# Patient Record
Sex: Female | Born: 1990 | Race: White | Hispanic: No | Marital: Single | Smoking: Never smoker
Health system: Southern US, Community
[De-identification: ages and names within clinical notes are randomized; demographics above are authoritative.]

## PROBLEM LIST (undated history)

## (undated) DIAGNOSIS — F4310 Post-traumatic stress disorder, unspecified: Secondary | ICD-10-CM

## (undated) DIAGNOSIS — K259 Gastric ulcer, unspecified as acute or chronic, without hemorrhage or perforation: Secondary | ICD-10-CM

## (undated) HISTORY — PX: KNEE SURGERY: SHX244

---

## 2011-05-01 ENCOUNTER — Ambulatory Visit (INDEPENDENT_AMBULATORY_CARE_PROVIDER_SITE_OTHER)
Admission: RE | Admit: 2011-05-01 | Discharge: 2011-05-01 | Disposition: A | Discharge status: Home / Self Care | Payer: BC Managed Care – PPO | Source: Ambulatory Visit | Attending: Family Medicine | Admitting: Family Medicine

## 2011-05-01 ENCOUNTER — Other Ambulatory Visit (HOSPITAL_BASED_OUTPATIENT_CLINIC_OR_DEPARTMENT_OTHER): Payer: Self-pay | Admitting: Family Medicine

## 2011-05-01 ENCOUNTER — Ambulatory Visit (HOSPITAL_BASED_OUTPATIENT_CLINIC_OR_DEPARTMENT_OTHER)
Admission: RE | Admit: 2011-05-01 | Discharge: 2011-05-01 | Disposition: A | Discharge status: Home / Self Care | Payer: BC Managed Care – PPO | Source: Ambulatory Visit | Attending: Internal Medicine | Admitting: Internal Medicine

## 2011-05-01 DIAGNOSIS — R109 Unspecified abdominal pain: Secondary | ICD-10-CM

## 2012-01-23 IMAGING — CT CT ABD-PELV W/O CM
2 of 4 series · 16 of 46 positions shown, 18 images · non-contrast
Comparison: None

CLINICAL DATA: 19-year-old female with right flank, abdominal and
pelvic pain.

CT ABDOMEN AND PELVIS WITHOUT CONTRAST
TECHNIQUE: Multidetector CT imaging of the abdomen and pelvis was
performed following the standard protocol without intravenous
contrast.

[Series 2: renal stone < 200 lbs 5.0 b31f · axial · 0.79mm/px · z∈[-436,-46]mm · 13 of 86 slices shown, 15 images]
[im 4/86  soft-tissue]
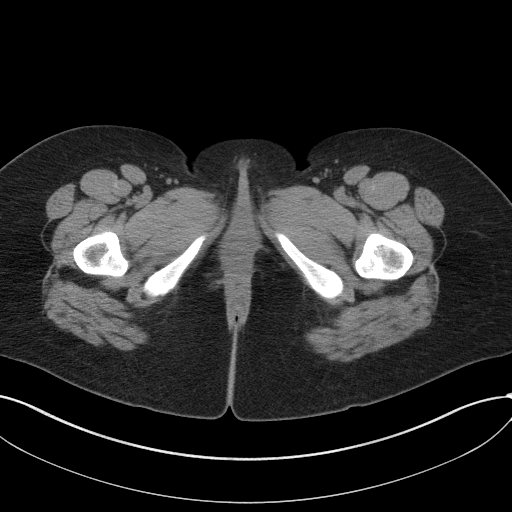
[im 4/86  bone]
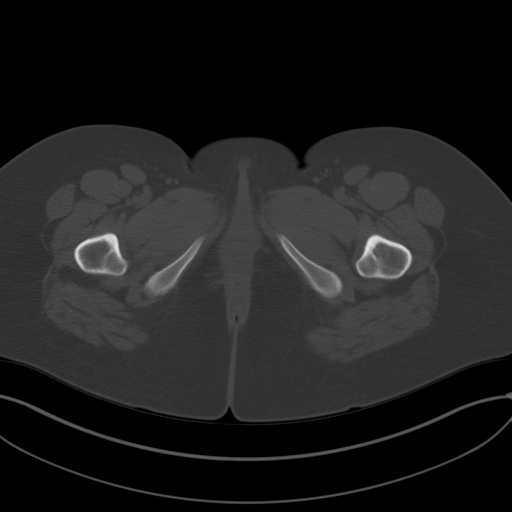
[im 11/86  soft-tissue]
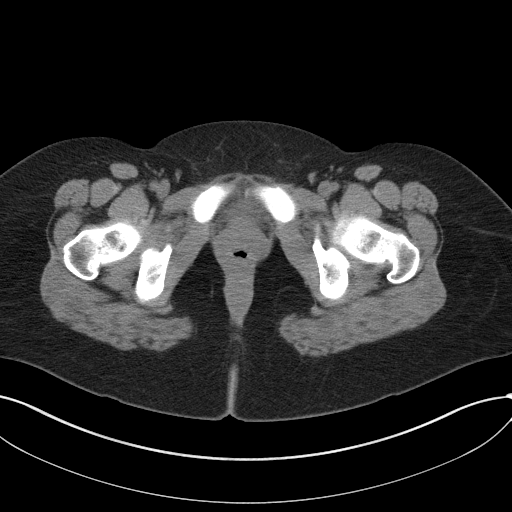
[im 18/86  soft-tissue]
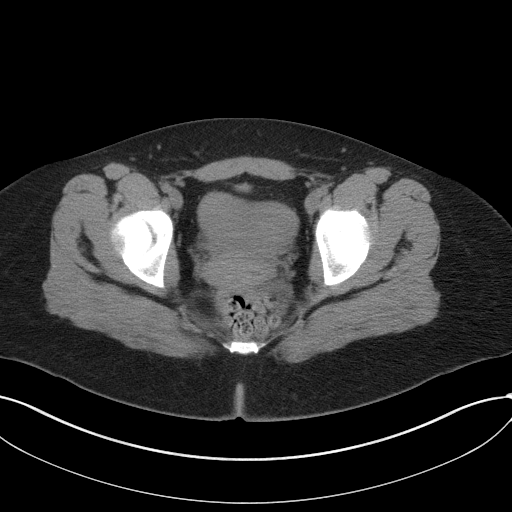
[im 25/86  soft-tissue]
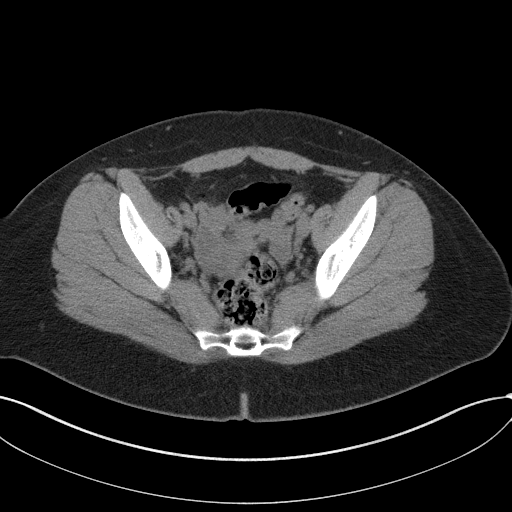
[im 29/86  soft-tissue]
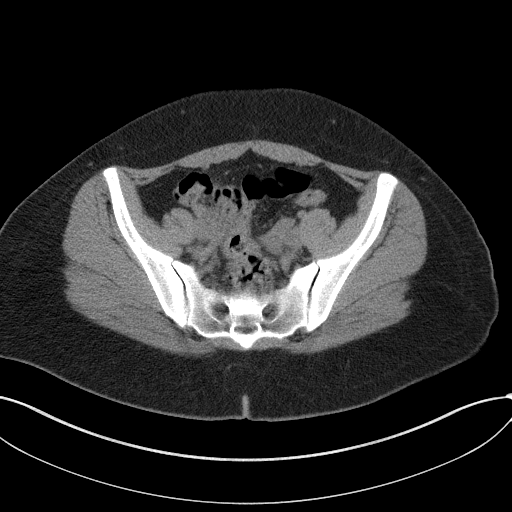
[im 36/86  soft-tissue]
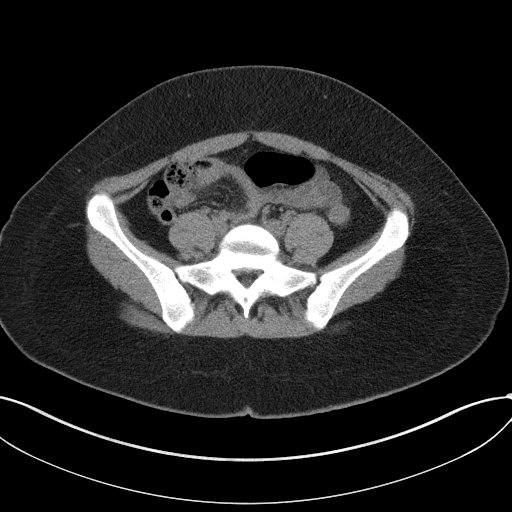
[im 43/86  soft-tissue]
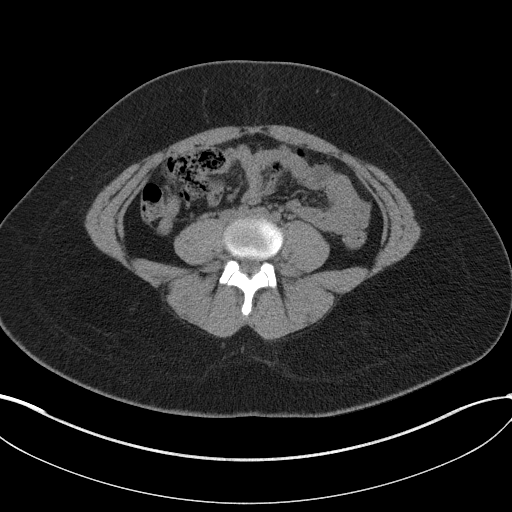
[im 50/86  soft-tissue]
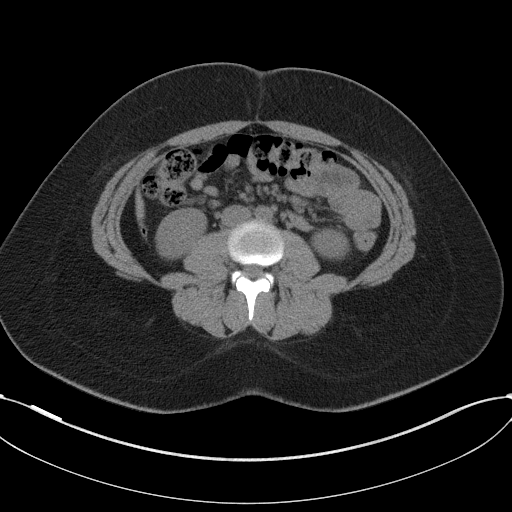
[im 57/86  soft-tissue]
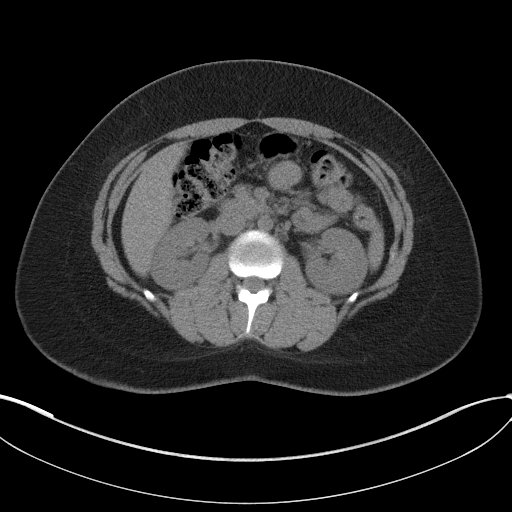
[im 57/86  bone]
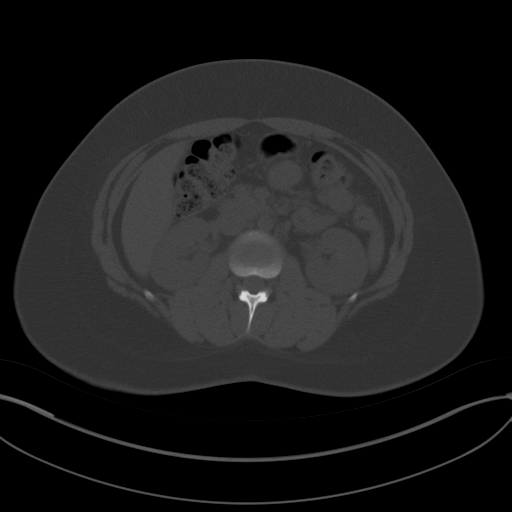
[im 61/86  soft-tissue]
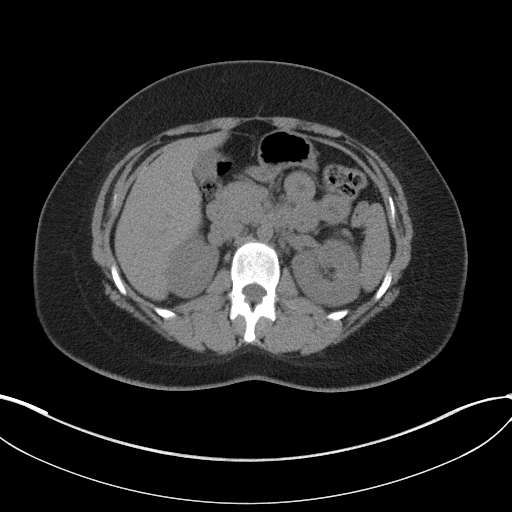
[im 68/86  soft-tissue]
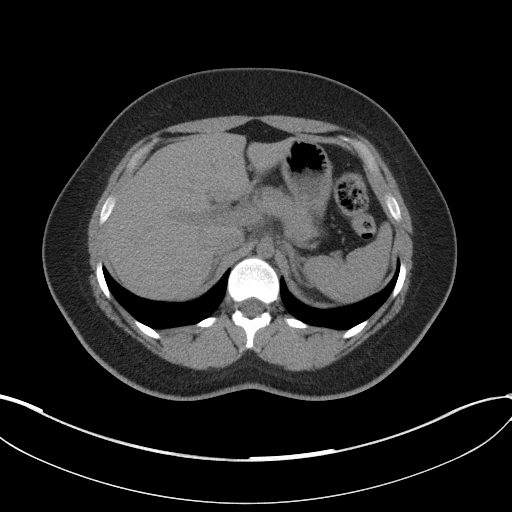
[im 75/86  soft-tissue]
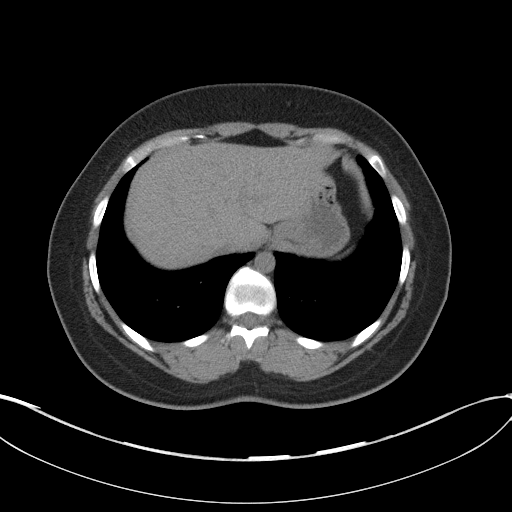
[im 82/86  soft-tissue]
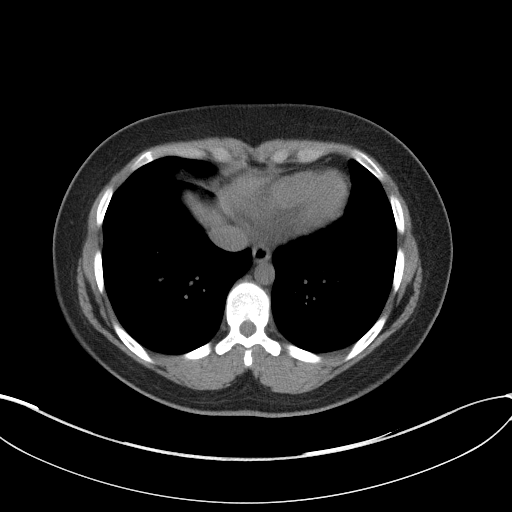

[Series 5: renal stone 3.0 coronal · coronal · 0.84mm/px · 3 of 71 slices shown]
[im 24/71  soft-tissue]
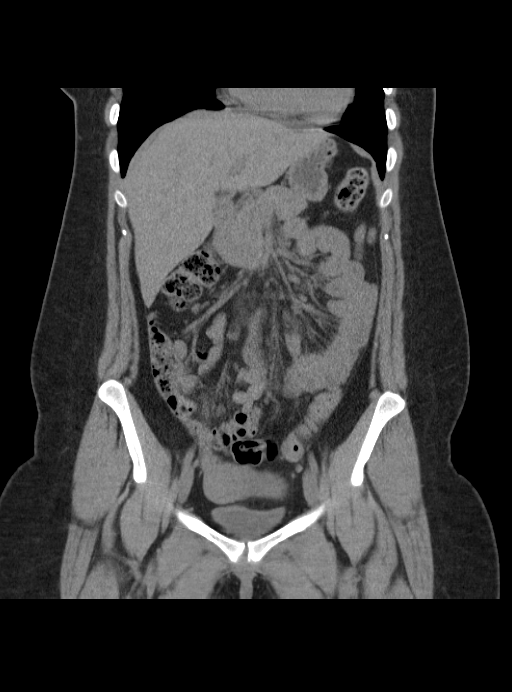
[im 32/71  soft-tissue]
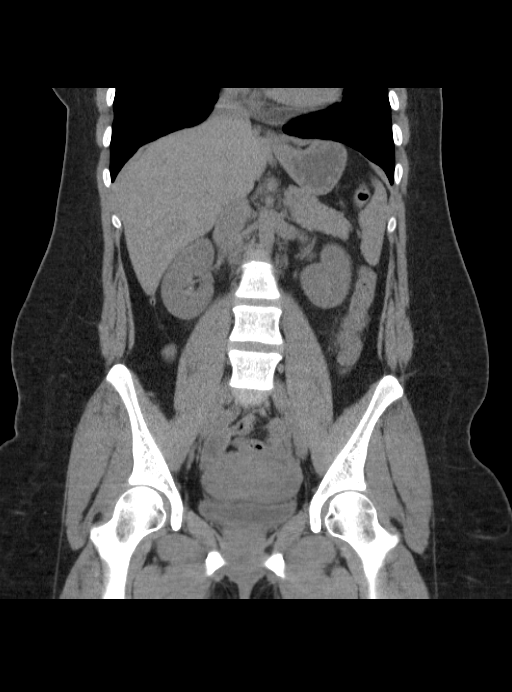
[im 39/71  soft-tissue]
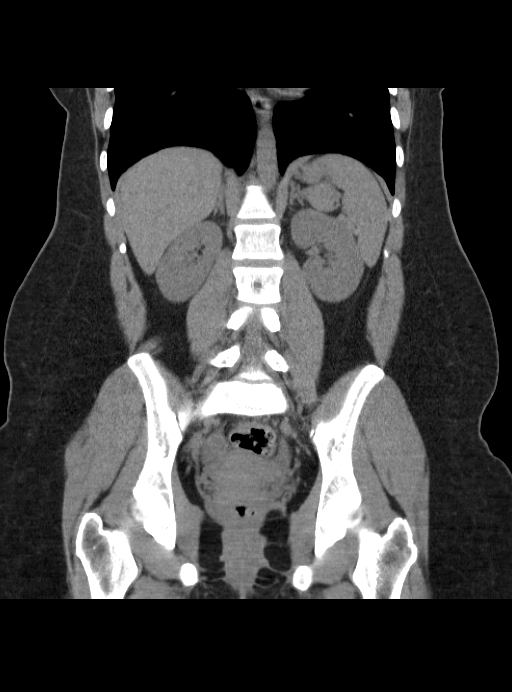

[16 of 46 positions shown; findings below may reference images not displayed]

FINDINGS: The lung bases are clear.
The kidneys are unremarkable.  There is no evidence of urinary
calculi or hydronephrosis.
The spleen, kidneys, adrenal glands, liver and gallbladder are
unremarkable.
Please note that parenchymal abnormalities may be missed as
intravenous contrast was not administered.

No free fluid, enlarged lymph nodes, biliary dilation or abdominal
aortic aneurysm identified.
The bowel, appendix and bladder are unremarkable.

The bony structures are unremarkable.
IMPRESSION: No acute or significant abnormalities identified.

## 2013-11-21 ENCOUNTER — Encounter (HOSPITAL_COMMUNITY): Payer: Self-pay | Admitting: Emergency Medicine

## 2013-11-21 ENCOUNTER — Emergency Department (HOSPITAL_COMMUNITY)
Admission: EM | Admit: 2013-11-21 | Discharge: 2013-11-22 | Disposition: A | Discharge status: Home / Self Care | Payer: BC Managed Care – PPO | Attending: Emergency Medicine | Admitting: Emergency Medicine

## 2013-11-21 DIAGNOSIS — F4323 Adjustment disorder with mixed anxiety and depressed mood: Secondary | ICD-10-CM

## 2013-11-21 DIAGNOSIS — F329 Major depressive disorder, single episode, unspecified: Secondary | ICD-10-CM | POA: Insufficient documentation

## 2013-11-21 DIAGNOSIS — T1491XA Suicide attempt, initial encounter: Secondary | ICD-10-CM

## 2013-11-21 DIAGNOSIS — F3289 Other specified depressive episodes: Secondary | ICD-10-CM | POA: Insufficient documentation

## 2013-11-21 DIAGNOSIS — Z8719 Personal history of other diseases of the digestive system: Secondary | ICD-10-CM | POA: Insufficient documentation

## 2013-11-21 DIAGNOSIS — Z3202 Encounter for pregnancy test, result negative: Secondary | ICD-10-CM | POA: Insufficient documentation

## 2013-11-21 DIAGNOSIS — R45851 Suicidal ideations: Secondary | ICD-10-CM | POA: Insufficient documentation

## 2013-11-21 DIAGNOSIS — G479 Sleep disorder, unspecified: Secondary | ICD-10-CM | POA: Insufficient documentation

## 2013-11-21 DIAGNOSIS — F411 Generalized anxiety disorder: Secondary | ICD-10-CM | POA: Insufficient documentation

## 2013-11-21 DIAGNOSIS — IMO0002 Reserved for concepts with insufficient information to code with codable children: Secondary | ICD-10-CM | POA: Insufficient documentation

## 2013-11-21 HISTORY — DX: Gastric ulcer, unspecified as acute or chronic, without hemorrhage or perforation: K25.9

## 2013-11-21 HISTORY — DX: Post-traumatic stress disorder, unspecified: F43.10

## 2013-11-21 LAB — PREGNANCY, URINE: Preg Test, Ur: NEGATIVE

## 2013-11-21 LAB — COMPREHENSIVE METABOLIC PANEL
ALT: 13 U/L (ref 0–35)
AST: 19 U/L (ref 0–37)
Albumin: 4.5 g/dL (ref 3.5–5.2)
Alkaline Phosphatase: 49 U/L (ref 39–117)
BUN: 14 mg/dL (ref 6–23)
CALCIUM: 9.4 mg/dL (ref 8.4–10.5)
CO2: 23 meq/L (ref 19–32)
CREATININE: 0.76 mg/dL (ref 0.50–1.10)
Chloride: 103 mEq/L (ref 96–112)
Glucose, Bld: 110 mg/dL — ABNORMAL HIGH (ref 70–99)
Potassium: 3.6 mEq/L — ABNORMAL LOW (ref 3.7–5.3)
Sodium: 140 mEq/L (ref 137–147)
Total Bilirubin: 0.3 mg/dL (ref 0.3–1.2)
Total Protein: 7.8 g/dL (ref 6.0–8.3)

## 2013-11-21 LAB — CBC
HEMATOCRIT: 40.4 % (ref 36.0–46.0)
Hemoglobin: 13.9 g/dL (ref 12.0–15.0)
MCH: 31.2 pg (ref 26.0–34.0)
MCHC: 34.4 g/dL (ref 30.0–36.0)
MCV: 90.6 fL (ref 78.0–100.0)
PLATELETS: 345 10*3/uL (ref 150–400)
RBC: 4.46 MIL/uL (ref 3.87–5.11)
RDW: 12.2 % (ref 11.5–15.5)
WBC: 8.5 10*3/uL (ref 4.0–10.5)

## 2013-11-21 LAB — RAPID URINE DRUG SCREEN, HOSP PERFORMED
Amphetamines: NOT DETECTED
Barbiturates: NOT DETECTED
Benzodiazepines: NOT DETECTED
COCAINE: NOT DETECTED
OPIATES: NOT DETECTED
TETRAHYDROCANNABINOL: POSITIVE — AB

## 2013-11-21 LAB — ETHANOL

## 2013-11-21 MED ORDER — ZOLPIDEM TARTRATE 5 MG PO TABS: 5.0000 mg | ORAL_TABLET | Freq: Every evening | ORAL | Status: DC | PRN

## 2013-11-21 MED ORDER — ONDANSETRON HCL 4 MG PO TABS
4.0000 mg | ORAL_TABLET | Freq: Three times a day (TID) | ORAL | Status: DC | PRN
  Administered 2013-11-21: 4 mg via ORAL
  Filled 2013-11-21: qty 1

## 2013-11-21 MED ORDER — ALUM & MAG HYDROXIDE-SIMETH 200-200-20 MG/5ML PO SUSP: 30.0000 mL | ORAL | Status: DC | PRN

## 2013-11-21 MED ORDER — LORAZEPAM 1 MG PO TABS
1.0000 mg | ORAL_TABLET | Freq: Once | ORAL | Status: AC
  Administered 2013-11-21: 1 mg via ORAL
  Filled 2013-11-21: qty 1

## 2013-11-21 MED ORDER — ACETAMINOPHEN 325 MG PO TABS: 650.0000 mg | ORAL_TABLET | ORAL | Status: DC | PRN

## 2013-11-21 MED ORDER — IBUPROFEN 200 MG PO TABS: 600.0000 mg | ORAL_TABLET | Freq: Three times a day (TID) | ORAL | Status: DC | PRN
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
## 2013-11-21 NOTE — ED Notes (Signed)
Patient denies SI, Hi, AVH at this time. Contracts for safety on the unit. Is unable to quantify her feelings of anxiety, states that she feels empty. Patient feeling hopeless rating depression 7/10. States she has insomnia sleeping about an hour at a time for last 6 months. Feels that everyone that she lets into her life ends up letting her down. States she can't find "good people to trust". Says medications have not helped her in the past and she hasn't taken any Rx since 2011 when she stopped seeing therapist. Patient is feeling alone. States "Most days I don't see any point for me to live".   Encouragement offered. Environment adjusted. Given Ativan.   Patient safety maintained, Q 15 checks continue.

## 2013-11-21 NOTE — ED Provider Notes (Signed)
Medical screening examination/treatment/procedure(s) were performed by non-physician practitioner and as supervising physician I was immediately available for consultation/collaboration.   EKG Interpretation None      Devoria AlbeIva Shanta Hartner, MD, Armando GangFACEP   Ward GivensIva L Nephi Savage, MD 11/21/13 [phone]
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
## 2013-11-21 NOTE — ED Notes (Signed)
[facility] [phone]

## 2013-11-21 NOTE — ED Notes (Signed)
Patient anxious, n/v. Tearful. Given Zofran. Encouragement offered. Q 15 checks continue.

## 2013-11-22 DIAGNOSIS — F4323 Adjustment disorder with mixed anxiety and depressed mood: Secondary | ICD-10-CM

## 2013-11-22 NOTE — Progress Notes (Signed)
[facility] provided pt with a [facility] Orange Card application, highlighting [facility], to help patient establish primary care.  °
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
## 2013-11-22 NOTE — Consult Note (Signed)
Review of Systems   Constitutional: Negative.    HENT: Negative.    Eyes: Negative.    Respiratory: Negative.    Cardiovascular: Negative.    Gastrointestinal: Negative.    Genitourinary: Negative.    Musculoskeletal: Negative.    Skin: Negative.    Neurological: Negative.    Endo/Heme/Allergies: Negative.    Psychiatric/Behavioral: Positive for depression.

## 2013-11-22 NOTE — [facility] Suicide Risk Assessment (Signed)
Suicide Risk Assessment  Discharge Assessment     Demographic Factors:  Adolescent or young adult and Caucasian  Total Time spent with patient: 1 hour  Psychiatric Specialty Exam:     Blood pressure 99/65, pulse 70, temperature 98 F (36.7 C), temperature source Oral, resp. rate 16, SpO2 99.00%.There is no height or weight on file to calculate BMI.  General Appearance: Casual  Eye Contact::  Good  Speech:  Clear and Coherent  Volume:  Normal  Mood:  Angry, Anxious and Depressed  Affect:  Appropriate and Congruent  Thought Process:  Coherent  Orientation:  Full (Time, Place, and Person)  Thought Content:  Negative  Suicidal Thoughts:  No  Homicidal Thoughts:  No  Memory:  Immediate;   Good Recent;   Good Remote;   Good  Judgement:  Intact  Insight:  Good  Psychomotor Activity:  Normal  Concentration:  Good  Recall:  Good  Fund of Knowledge:Good  Language: Good  Akathisia:  Negative  Handed:  Right  AIMS (if indicated):     Assets:  Communication Skills Desire for Improvement Financial Resources/Insurance Housing Intimacy Leisure Time Physical Health Resilience Social Support Talents/Skills Transportation Vocational/Educational  Sleep:       Musculoskeletal: Strength & Muscle Tone: within normal limits Gait & Station: normal Patient leans: N/A   Mental Status Per Nursing Assessment::   On Admission:     Current Mental Status by Physician: NA  Loss Factors: NA  Historical Factors: NA  Risk Reduction Factors:   Religious beliefs about death, Employed, Living with another person, especially a relative and Positive social support  Continued Clinical Symptoms:  Still upset  Cognitive Features That Contribute To Risk:  none   Suicide Risk:  Minimal: No identifiable suicidal ideation.  Patients presenting with no risk factors but with morbid ruminations; may be classified as minimal risk based on the severity of the depressive  symptoms  Discharge Diagnoses:   AXIS I:  Adjustment Disorder with Mixed Emotional Features AXIS II:  Deferred AXIS III:   Past Medical History  Diagnosis Date  . PTSD (post-traumatic stress disorder)   . Multiple gastric ulcers    AXIS IV:  problems with primary support group AXIS V:  51-60 moderate symptoms  Plan Of Care/Follow-up recommendations:  Activity:  resume usual activity Diet:  resume usual diet  Is patient on multiple antipsychotic therapies at discharge:  No   Has Patient had three or more failed trials of antipsychotic monotherapy by history:  No  Recommended Plan for Multiple Antipsychotic Therapies: NA    Leilene Diprima D 11/22/2013, 10:16 AM
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
## 2013-11-22 NOTE — ED Notes (Addendum)
Written dc instructions reviewed w/ patient, Pt encouraged to follow up w/ OP treatment referrals, and return for any suicidal thoughts/urges.  Pt verbalized understanding.  Pt's ride is here, and back to see.  Pt ambulatory to dc window w/ mHt, belongings returned after leaving the area.

## 2013-11-22 NOTE — ED Notes (Signed)
Pt resting quietly, is to be dc'd home and is waiting for her ride.  Pt reports that she feels that she safe to go home and will follow up w/ OP resources given.  Pt denies si/hi/avh at this time, and was encouraged to return/seek treatment for any return of suicidal thoughts/urges.  Pt verbalized understanding.

## 2013-11-22 NOTE — ED Notes (Signed)
Dr taylor and shuvon np into see 

## 2013-11-22 NOTE — Progress Notes (Signed)
Per discussion with psychiatrist and np, patient psychiatrically stable for discharge home. CSW provided patient with outpatient referrals including mobile crisis.   Byrd HesselbachKristen Elsi Stelzer, LCSW [phone]  ED CSW 11/22/2013 1018am

## 2013-11-22 NOTE — ED Notes (Signed)
CSW into see
